# Patient Record
Sex: Male | Born: 1965 | Race: White | Hispanic: No | Marital: Married | Smoking: Never smoker
Health system: Southern US, Community
[De-identification: ages and names within clinical notes are randomized; demographics above are authoritative.]

## PROBLEM LIST (undated history)

## (undated) DIAGNOSIS — Q211 Atrial septal defect, unspecified: Secondary | ICD-10-CM

---

## 1994-08-25 HISTORY — PX: TRANSESOPHAGEAL ECHOCARDIOGRAM: SHX273

## 2010-05-01 ENCOUNTER — Ambulatory Visit (HOSPITAL_BASED_OUTPATIENT_CLINIC_OR_DEPARTMENT_OTHER): Admission: RE | Admit: 2010-05-01 | Discharge: 2010-05-01 | Payer: Self-pay | Admitting: Family Medicine

## 2010-05-01 ENCOUNTER — Ambulatory Visit: Payer: Self-pay | Admitting: Diagnostic Radiology

## 2011-05-02 ENCOUNTER — Encounter (INDEPENDENT_AMBULATORY_CARE_PROVIDER_SITE_OTHER): Payer: 59 | Admitting: Ophthalmology

## 2011-05-02 DIAGNOSIS — H43819 Vitreous degeneration, unspecified eye: Secondary | ICD-10-CM

## 2011-05-02 DIAGNOSIS — H33309 Unspecified retinal break, unspecified eye: Secondary | ICD-10-CM

## 2011-05-02 DIAGNOSIS — H33009 Unspecified retinal detachment with retinal break, unspecified eye: Secondary | ICD-10-CM

## 2011-05-02 DIAGNOSIS — H4420 Degenerative myopia, unspecified eye: Secondary | ICD-10-CM

## 2011-05-09 ENCOUNTER — Encounter (INDEPENDENT_AMBULATORY_CARE_PROVIDER_SITE_OTHER): Payer: 59 | Admitting: Ophthalmology

## 2011-05-09 DIAGNOSIS — H33309 Unspecified retinal break, unspecified eye: Secondary | ICD-10-CM

## 2011-09-08 ENCOUNTER — Ambulatory Visit (INDEPENDENT_AMBULATORY_CARE_PROVIDER_SITE_OTHER): Payer: 59 | Admitting: Ophthalmology

## 2011-09-18 ENCOUNTER — Ambulatory Visit (INDEPENDENT_AMBULATORY_CARE_PROVIDER_SITE_OTHER): Payer: 59 | Admitting: Ophthalmology

## 2011-09-25 ENCOUNTER — Ambulatory Visit (INDEPENDENT_AMBULATORY_CARE_PROVIDER_SITE_OTHER): Payer: 59 | Admitting: Ophthalmology

## 2011-09-25 DIAGNOSIS — H43819 Vitreous degeneration, unspecified eye: Secondary | ICD-10-CM

## 2011-09-25 DIAGNOSIS — H33009 Unspecified retinal detachment with retinal break, unspecified eye: Secondary | ICD-10-CM

## 2011-09-25 DIAGNOSIS — H4420 Degenerative myopia, unspecified eye: Secondary | ICD-10-CM

## 2011-09-25 DIAGNOSIS — H33309 Unspecified retinal break, unspecified eye: Secondary | ICD-10-CM

## 2011-09-25 DIAGNOSIS — H2510 Age-related nuclear cataract, unspecified eye: Secondary | ICD-10-CM

## 2012-09-24 ENCOUNTER — Ambulatory Visit (INDEPENDENT_AMBULATORY_CARE_PROVIDER_SITE_OTHER): Payer: 59 | Admitting: Ophthalmology

## 2012-09-24 DIAGNOSIS — H33009 Unspecified retinal detachment with retinal break, unspecified eye: Secondary | ICD-10-CM

## 2012-09-24 DIAGNOSIS — H43819 Vitreous degeneration, unspecified eye: Secondary | ICD-10-CM

## 2012-09-24 DIAGNOSIS — H33309 Unspecified retinal break, unspecified eye: Secondary | ICD-10-CM

## 2012-09-24 DIAGNOSIS — H4420 Degenerative myopia, unspecified eye: Secondary | ICD-10-CM

## 2013-06-06 ENCOUNTER — Encounter (INDEPENDENT_AMBULATORY_CARE_PROVIDER_SITE_OTHER): Payer: 59 | Admitting: Ophthalmology

## 2013-06-06 DIAGNOSIS — H2510 Age-related nuclear cataract, unspecified eye: Secondary | ICD-10-CM

## 2013-06-06 DIAGNOSIS — H1130 Conjunctival hemorrhage, unspecified eye: Secondary | ICD-10-CM

## 2013-06-06 DIAGNOSIS — H43819 Vitreous degeneration, unspecified eye: Secondary | ICD-10-CM

## 2013-06-06 DIAGNOSIS — H4420 Degenerative myopia, unspecified eye: Secondary | ICD-10-CM

## 2013-06-06 DIAGNOSIS — H33009 Unspecified retinal detachment with retinal break, unspecified eye: Secondary | ICD-10-CM

## 2013-09-26 ENCOUNTER — Ambulatory Visit (INDEPENDENT_AMBULATORY_CARE_PROVIDER_SITE_OTHER): Payer: 59 | Admitting: Ophthalmology

## 2013-10-28 ENCOUNTER — Encounter (INDEPENDENT_AMBULATORY_CARE_PROVIDER_SITE_OTHER): Payer: 59 | Admitting: Ophthalmology

## 2013-10-28 DIAGNOSIS — H43819 Vitreous degeneration, unspecified eye: Secondary | ICD-10-CM

## 2013-10-28 DIAGNOSIS — H35039 Hypertensive retinopathy, unspecified eye: Secondary | ICD-10-CM

## 2013-10-28 DIAGNOSIS — H33009 Unspecified retinal detachment with retinal break, unspecified eye: Secondary | ICD-10-CM

## 2013-10-28 DIAGNOSIS — H33309 Unspecified retinal break, unspecified eye: Secondary | ICD-10-CM

## 2013-10-28 DIAGNOSIS — H4420 Degenerative myopia, unspecified eye: Secondary | ICD-10-CM

## 2013-10-28 DIAGNOSIS — I10 Essential (primary) hypertension: Secondary | ICD-10-CM

## 2014-05-04 ENCOUNTER — Encounter (INDEPENDENT_AMBULATORY_CARE_PROVIDER_SITE_OTHER): Payer: 59 | Admitting: Ophthalmology

## 2014-05-04 DIAGNOSIS — I10 Essential (primary) hypertension: Secondary | ICD-10-CM

## 2014-05-04 DIAGNOSIS — H4420 Degenerative myopia, unspecified eye: Secondary | ICD-10-CM

## 2014-05-04 DIAGNOSIS — H2510 Age-related nuclear cataract, unspecified eye: Secondary | ICD-10-CM

## 2014-05-04 DIAGNOSIS — H35039 Hypertensive retinopathy, unspecified eye: Secondary | ICD-10-CM

## 2014-05-04 DIAGNOSIS — H43819 Vitreous degeneration, unspecified eye: Secondary | ICD-10-CM

## 2014-05-04 DIAGNOSIS — H33009 Unspecified retinal detachment with retinal break, unspecified eye: Secondary | ICD-10-CM

## 2014-05-31 ENCOUNTER — Encounter (INDEPENDENT_AMBULATORY_CARE_PROVIDER_SITE_OTHER): Payer: 59 | Admitting: Ophthalmology

## 2014-05-31 DIAGNOSIS — H4423 Degenerative myopia, bilateral: Secondary | ICD-10-CM

## 2014-05-31 DIAGNOSIS — H43813 Vitreous degeneration, bilateral: Secondary | ICD-10-CM

## 2014-05-31 DIAGNOSIS — H35033 Hypertensive retinopathy, bilateral: Secondary | ICD-10-CM

## 2014-05-31 DIAGNOSIS — H338 Other retinal detachments: Secondary | ICD-10-CM

## 2014-05-31 DIAGNOSIS — I10 Essential (primary) hypertension: Secondary | ICD-10-CM

## 2014-06-07 ENCOUNTER — Ambulatory Visit (INDEPENDENT_AMBULATORY_CARE_PROVIDER_SITE_OTHER): Payer: 59 | Admitting: Ophthalmology

## 2014-07-03 ENCOUNTER — Encounter (INDEPENDENT_AMBULATORY_CARE_PROVIDER_SITE_OTHER): Payer: 59 | Admitting: Ophthalmology

## 2014-07-03 DIAGNOSIS — I10 Essential (primary) hypertension: Secondary | ICD-10-CM

## 2014-07-03 DIAGNOSIS — H4423 Degenerative myopia, bilateral: Secondary | ICD-10-CM

## 2014-07-03 DIAGNOSIS — H2511 Age-related nuclear cataract, right eye: Secondary | ICD-10-CM

## 2014-07-03 DIAGNOSIS — H43813 Vitreous degeneration, bilateral: Secondary | ICD-10-CM

## 2014-07-03 DIAGNOSIS — H35033 Hypertensive retinopathy, bilateral: Secondary | ICD-10-CM

## 2014-07-03 DIAGNOSIS — H338 Other retinal detachments: Secondary | ICD-10-CM

## 2015-01-01 ENCOUNTER — Ambulatory Visit (INDEPENDENT_AMBULATORY_CARE_PROVIDER_SITE_OTHER): Payer: 59 | Admitting: Ophthalmology

## 2015-04-16 ENCOUNTER — Ambulatory Visit (INDEPENDENT_AMBULATORY_CARE_PROVIDER_SITE_OTHER): Payer: 59 | Admitting: Ophthalmology

## 2015-04-16 DIAGNOSIS — H35033 Hypertensive retinopathy, bilateral: Secondary | ICD-10-CM

## 2015-04-16 DIAGNOSIS — H43813 Vitreous degeneration, bilateral: Secondary | ICD-10-CM | POA: Diagnosis not present

## 2015-04-16 DIAGNOSIS — I10 Essential (primary) hypertension: Secondary | ICD-10-CM

## 2015-04-16 DIAGNOSIS — H4423 Degenerative myopia, bilateral: Secondary | ICD-10-CM | POA: Diagnosis not present

## 2015-04-16 DIAGNOSIS — H2511 Age-related nuclear cataract, right eye: Secondary | ICD-10-CM

## 2015-04-16 DIAGNOSIS — H338 Other retinal detachments: Secondary | ICD-10-CM | POA: Diagnosis not present

## 2015-05-08 ENCOUNTER — Ambulatory Visit (INDEPENDENT_AMBULATORY_CARE_PROVIDER_SITE_OTHER): Payer: 59 | Admitting: Ophthalmology

## 2016-04-23 ENCOUNTER — Ambulatory Visit (INDEPENDENT_AMBULATORY_CARE_PROVIDER_SITE_OTHER): Payer: 59 | Admitting: Ophthalmology

## 2016-05-07 ENCOUNTER — Ambulatory Visit (INDEPENDENT_AMBULATORY_CARE_PROVIDER_SITE_OTHER): Payer: 59 | Admitting: Ophthalmology

## 2016-05-21 ENCOUNTER — Ambulatory Visit (INDEPENDENT_AMBULATORY_CARE_PROVIDER_SITE_OTHER): Payer: 59 | Admitting: Ophthalmology

## 2016-05-21 DIAGNOSIS — H338 Other retinal detachments: Secondary | ICD-10-CM

## 2016-05-21 DIAGNOSIS — H35033 Hypertensive retinopathy, bilateral: Secondary | ICD-10-CM | POA: Diagnosis not present

## 2016-05-21 DIAGNOSIS — I10 Essential (primary) hypertension: Secondary | ICD-10-CM | POA: Diagnosis not present

## 2016-05-21 DIAGNOSIS — H43813 Vitreous degeneration, bilateral: Secondary | ICD-10-CM

## 2016-05-21 DIAGNOSIS — H4423 Degenerative myopia, bilateral: Secondary | ICD-10-CM | POA: Diagnosis not present

## 2016-10-11 DIAGNOSIS — H578 Other specified disorders of eye and adnexa: Secondary | ICD-10-CM | POA: Diagnosis not present

## 2016-11-08 DIAGNOSIS — H578 Other specified disorders of eye and adnexa: Secondary | ICD-10-CM | POA: Diagnosis not present

## 2017-01-05 DIAGNOSIS — Z131 Encounter for screening for diabetes mellitus: Secondary | ICD-10-CM | POA: Diagnosis not present

## 2017-01-05 DIAGNOSIS — E7800 Pure hypercholesterolemia, unspecified: Secondary | ICD-10-CM | POA: Diagnosis not present

## 2017-01-05 DIAGNOSIS — Z0000 Encounter for general adult medical examination without abnormal findings: Secondary | ICD-10-CM | POA: Diagnosis not present

## 2017-01-05 DIAGNOSIS — G44219 Episodic tension-type headache, not intractable: Secondary | ICD-10-CM | POA: Diagnosis not present

## 2017-01-05 DIAGNOSIS — Z125 Encounter for screening for malignant neoplasm of prostate: Secondary | ICD-10-CM | POA: Diagnosis not present

## 2017-04-04 DIAGNOSIS — H40033 Anatomical narrow angle, bilateral: Secondary | ICD-10-CM | POA: Diagnosis not present

## 2017-04-04 DIAGNOSIS — H04123 Dry eye syndrome of bilateral lacrimal glands: Secondary | ICD-10-CM | POA: Diagnosis not present

## 2017-04-26 ENCOUNTER — Emergency Department (HOSPITAL_BASED_OUTPATIENT_CLINIC_OR_DEPARTMENT_OTHER)
Admission: EM | Admit: 2017-04-26 | Discharge: 2017-04-26 | Disposition: A | Discharge status: Home / Self Care | Payer: 59 | Attending: Emergency Medicine | Admitting: Emergency Medicine

## 2017-04-26 ENCOUNTER — Encounter (HOSPITAL_BASED_OUTPATIENT_CLINIC_OR_DEPARTMENT_OTHER): Payer: Self-pay | Admitting: Emergency Medicine

## 2017-04-26 DIAGNOSIS — S59911A Unspecified injury of right forearm, initial encounter: Secondary | ICD-10-CM | POA: Diagnosis not present

## 2017-04-26 DIAGNOSIS — W458XXA Other foreign body or object entering through skin, initial encounter: Secondary | ICD-10-CM | POA: Insufficient documentation

## 2017-04-26 DIAGNOSIS — S50851A Superficial foreign body of right forearm, initial encounter: Secondary | ICD-10-CM | POA: Diagnosis not present

## 2017-04-26 DIAGNOSIS — Y929 Unspecified place or not applicable: Secondary | ICD-10-CM | POA: Diagnosis not present

## 2017-04-26 DIAGNOSIS — S51841A Puncture wound with foreign body of right forearm, initial encounter: Secondary | ICD-10-CM | POA: Insufficient documentation

## 2017-04-26 DIAGNOSIS — Z23 Encounter for immunization: Secondary | ICD-10-CM | POA: Diagnosis not present

## 2017-04-26 DIAGNOSIS — Y999 Unspecified external cause status: Secondary | ICD-10-CM | POA: Insufficient documentation

## 2017-04-26 DIAGNOSIS — Y9389 Activity, other specified: Secondary | ICD-10-CM | POA: Insufficient documentation

## 2017-04-26 MED ORDER — TETANUS-DIPHTH-ACELL PERTUSSIS 5-2.5-18.5 LF-MCG/0.5 IM SUSP
0.5000 mL | Freq: Once | INTRAMUSCULAR | Status: AC
  Administered 2017-04-26: 0.5 mL via INTRAMUSCULAR
  Filled 2017-04-26: qty 0.5

## 2017-04-26 NOTE — ED Triage Notes (Signed)
Pt has fish hook in R forearm.

## 2017-04-26 NOTE — Discharge Instructions (Signed)
Wash with soap and water.  Neosporin to area

## 2017-04-26 NOTE — ED Provider Notes (Signed)
[facility] Provider Note   CSN: [id number] Arrival date & time: 04/26/17  1833     History   Chief Complaint Chief Complaint  Patient presents with  . Puncture Wound    HPI Terry Gillespie is a 51 y.o. male.  Patient is a 51 year old male presenting today because he has a fishhook stuck in his right arm. He was fishing when his lower got stuck any pullback causing the hook to get lodged into the palmar surface of his distal right forearm. He denies any numbness or tingling in his fingers to his no excessive bleeding. Tetanus shot is out of date.  Only minimal pain at this time.   The history is provided by the patient.    History reviewed. No pertinent past medical history.  There are no active problems to display for this patient.   History reviewed. No pertinent surgical history.     Home Medications    Prior to Admission medications   Not on File    Family History No family history on file.  Social History Social History  Substance Use Topics  . Smoking status: Never Smoker  . Smokeless tobacco: Never Used  . Alcohol use No     Allergies   Patient has no known allergies.   Review of Systems Review of Systems  All other systems reviewed and are negative.    Physical Exam Updated Vital Signs BP (!) 136/98 (BP Location: Left Arm)   Pulse 89   Temp 98.3 F (36.8 C) (Oral)   Resp 18   SpO2 100%   Physical Exam  Constitutional: He is oriented to person, place, and time. He appears well-developed and well-nourished. No distress.  HENT:  Head: Normocephalic.  Cardiovascular: Normal rate.   Pulmonary/Chest: Effort normal.  Musculoskeletal: Normal range of motion. He exhibits tenderness. He exhibits no edema.       Right wrist: He exhibits tenderness.       Arms: Normal sensation and movement of the right hand and fingers. 2+ radial pulse.  Neurological: He is alert and oriented to person, place, and time.  Skin: Skin is  warm and dry. No rash noted. No erythema.  Psychiatric: He has a normal mood and affect. His behavior is normal.  Nursing note and vitals reviewed.    ED Treatments / Results  Labs (all labs ordered are listed, but only abnormal results are displayed) Labs Reviewed - No data to display  EKG  EKG Interpretation None       Radiology No results found.  Procedures .Foreign Body Removal Date/Time: 04/26/2017 9:28 PM Performed by: Blanchie Dessert Authorized by: Blanchie Dessert  Consent: Verbal consent obtained. Consent given by: patient Patient understanding: patient states understanding of the procedure being performed Imaging studies: imaging studies not available Patient identity confirmed: verbally with patient Body area: skin General location: upper extremity Location details: right forearm Anesthesia: local infiltration  Anesthesia: Local Anesthetic: lidocaine 2% with epinephrine Anesthetic total: 2 mL  Sedation: Patient sedated: no Patient restrained: no Patient cooperative: yes Localization method: visualized Removal mechanism: fish hook was advanced through the skin with wire cutters and barb was cut and then hook removed. Dressing: antibiotic ointment and dressing applied Tendon involvement: superficial Depth: subcutaneous Complexity: simple 1 objects recovered. Objects recovered: fish hook Post-procedure assessment: foreign body removed Patient tolerance: Patient tolerated the procedure well with no immediate complications   (including critical care time)  Medications Ordered in ED Medications  Tdap (BOOSTRIX) injection 0.5 mL (  not administered)     Initial Impression / Assessment and Plan / ED Course  I have reviewed the triage vital signs and the nursing notes.  Pertinent labs & imaging results that were available during my care of the patient were reviewed by me and considered in my medical decision making (see chart for details).      Patient with fishhook to the right forearm.  Tetanus shot updated. Hook removed as above.  Final Clinical Impressions(s) / ED Diagnoses   Final diagnoses:  Foreign body in right forearm, initial encounter    New Prescriptions New Prescriptions   No medications on file     Blanchie Dessert, MD 04/26/17 2130

## 2017-05-27 ENCOUNTER — Ambulatory Visit (INDEPENDENT_AMBULATORY_CARE_PROVIDER_SITE_OTHER): Payer: 59 | Admitting: Ophthalmology

## 2017-06-04 DIAGNOSIS — Z23 Encounter for immunization: Secondary | ICD-10-CM | POA: Diagnosis not present

## 2017-06-04 DIAGNOSIS — H04123 Dry eye syndrome of bilateral lacrimal glands: Secondary | ICD-10-CM | POA: Diagnosis not present

## 2017-06-15 ENCOUNTER — Encounter (INDEPENDENT_AMBULATORY_CARE_PROVIDER_SITE_OTHER): Payer: 59 | Admitting: Ophthalmology

## 2017-06-15 DIAGNOSIS — H35033 Hypertensive retinopathy, bilateral: Secondary | ICD-10-CM

## 2017-06-15 DIAGNOSIS — H338 Other retinal detachments: Secondary | ICD-10-CM

## 2017-06-15 DIAGNOSIS — H4423 Degenerative myopia, bilateral: Secondary | ICD-10-CM | POA: Diagnosis not present

## 2017-06-15 DIAGNOSIS — H43813 Vitreous degeneration, bilateral: Secondary | ICD-10-CM | POA: Diagnosis not present

## 2017-06-15 DIAGNOSIS — I10 Essential (primary) hypertension: Secondary | ICD-10-CM

## 2017-07-24 DIAGNOSIS — H04123 Dry eye syndrome of bilateral lacrimal glands: Secondary | ICD-10-CM | POA: Diagnosis not present

## 2017-10-26 DIAGNOSIS — H1013 Acute atopic conjunctivitis, bilateral: Secondary | ICD-10-CM | POA: Diagnosis not present

## 2017-12-07 DIAGNOSIS — R0781 Pleurodynia: Secondary | ICD-10-CM | POA: Diagnosis not present

## 2017-12-07 DIAGNOSIS — B351 Tinea unguium: Secondary | ICD-10-CM | POA: Diagnosis not present

## 2018-01-28 DIAGNOSIS — E7800 Pure hypercholesterolemia, unspecified: Secondary | ICD-10-CM | POA: Diagnosis not present

## 2018-01-28 DIAGNOSIS — Z0000 Encounter for general adult medical examination without abnormal findings: Secondary | ICD-10-CM | POA: Diagnosis not present

## 2018-03-16 DIAGNOSIS — D123 Benign neoplasm of transverse colon: Secondary | ICD-10-CM | POA: Diagnosis not present

## 2018-03-16 DIAGNOSIS — Z1211 Encounter for screening for malignant neoplasm of colon: Secondary | ICD-10-CM | POA: Diagnosis not present

## 2018-05-31 DIAGNOSIS — Z23 Encounter for immunization: Secondary | ICD-10-CM | POA: Diagnosis not present

## 2018-06-15 ENCOUNTER — Encounter (INDEPENDENT_AMBULATORY_CARE_PROVIDER_SITE_OTHER): Payer: 59 | Admitting: Ophthalmology

## 2018-07-19 ENCOUNTER — Encounter (INDEPENDENT_AMBULATORY_CARE_PROVIDER_SITE_OTHER): Payer: 59 | Admitting: Ophthalmology

## 2018-07-19 DIAGNOSIS — H4423 Degenerative myopia, bilateral: Secondary | ICD-10-CM

## 2018-07-19 DIAGNOSIS — H338 Other retinal detachments: Secondary | ICD-10-CM | POA: Diagnosis not present

## 2018-07-19 DIAGNOSIS — H35033 Hypertensive retinopathy, bilateral: Secondary | ICD-10-CM | POA: Diagnosis not present

## 2018-07-19 DIAGNOSIS — I10 Essential (primary) hypertension: Secondary | ICD-10-CM

## 2018-07-19 DIAGNOSIS — H43813 Vitreous degeneration, bilateral: Secondary | ICD-10-CM

## 2018-08-07 DIAGNOSIS — H04123 Dry eye syndrome of bilateral lacrimal glands: Secondary | ICD-10-CM | POA: Diagnosis not present

## 2018-08-20 DIAGNOSIS — H10013 Acute follicular conjunctivitis, bilateral: Secondary | ICD-10-CM | POA: Diagnosis not present

## 2018-08-27 DIAGNOSIS — S61210A Laceration without foreign body of right index finger without damage to nail, initial encounter: Secondary | ICD-10-CM | POA: Diagnosis not present

## 2018-08-27 DIAGNOSIS — G8911 Acute pain due to trauma: Secondary | ICD-10-CM | POA: Diagnosis not present

## 2018-09-13 DIAGNOSIS — H1013 Acute atopic conjunctivitis, bilateral: Secondary | ICD-10-CM | POA: Diagnosis not present

## 2018-09-22 DIAGNOSIS — B09 Unspecified viral infection characterized by skin and mucous membrane lesions: Secondary | ICD-10-CM | POA: Diagnosis not present

## 2018-10-05 DIAGNOSIS — L237 Allergic contact dermatitis due to plants, except food: Secondary | ICD-10-CM | POA: Diagnosis not present

## 2019-07-20 ENCOUNTER — Encounter (INDEPENDENT_AMBULATORY_CARE_PROVIDER_SITE_OTHER): Payer: 59 | Admitting: Ophthalmology

## 2020-01-11 ENCOUNTER — Emergency Department (INDEPENDENT_AMBULATORY_CARE_PROVIDER_SITE_OTHER)
Admission: EM | Admit: 2020-01-11 | Discharge: 2020-01-11 | Disposition: A | Discharge status: Home / Self Care | Payer: 59 | Source: Home / Self Care | Attending: Emergency Medicine | Admitting: Emergency Medicine

## 2020-01-11 ENCOUNTER — Other Ambulatory Visit: Payer: Self-pay

## 2020-01-11 DIAGNOSIS — R300 Dysuria: Secondary | ICD-10-CM

## 2020-01-11 DIAGNOSIS — R112 Nausea with vomiting, unspecified: Secondary | ICD-10-CM

## 2020-01-11 DIAGNOSIS — N23 Unspecified renal colic: Secondary | ICD-10-CM | POA: Diagnosis not present

## 2020-01-11 LAB — POCT URINALYSIS DIP (MANUAL ENTRY)
Glucose, UA: NEGATIVE mg/dL
Leukocytes, UA: NEGATIVE
Nitrite, UA: NEGATIVE
Protein Ur, POC: 100 mg/dL — AB
Spec Grav, UA: >=1.03 — AB (ref 1.010–1.025)
Urobilinogen, UA: 1 E.U./dL
pH, UA: 5.5 (ref 5.0–8.0)

## 2020-01-11 MED ORDER — KETOROLAC TROMETHAMINE 60 MG/2ML IM SOLN
60.0000 mg | Freq: Once | INTRAMUSCULAR | Status: AC
  Administered 2020-01-11: 60 mg via INTRAMUSCULAR

## 2020-01-11 MED ORDER — PROMETHAZINE HCL 25 MG/ML IJ SOLN
25.0000 mg | INTRAMUSCULAR | Status: AC
  Administered 2020-01-11: 25 mg via INTRAMUSCULAR

## 2020-01-11 NOTE — Discharge Instructions (Addendum)
You likely have a right kidney stone, based on history of severe acute right back pain, nausea and vomiting. Urinalysis is positive for large amount of blood. Here in urgent care, we gave you a shot of Toradol 60 mg for pain and promethazine 25 mg for nausea and vomiting. Your wife needs to drive you now to local emergency room

## 2020-01-11 NOTE — ED Notes (Signed)
Patient is being discharged from the [facility] and sent to the Emergency Department via POV driven by wife. Per Dr. Burnett Harry, patient is stable but in need of higher level of care due to possible kidney stone and severe flank pain. Patient is aware and verbalizes understanding of plan of care.  Vitals:   01/11/20 1936  BP: 122/74  Pulse: 74  Resp: 17  Temp: 98.6 F (37 C)  SpO2: 100%

## 2020-01-11 NOTE — ED Provider Notes (Signed)
[facility]    CSN: [id number] Arrival date & time: 01/11/20  1924      History   Chief Complaint Chief Complaint  Patient presents with  . Dysuria    HPI Terry Gillespie is a 54 y.o. male.   HPI Patient presents to Urgent Care with complaints of discolored urine and right lower quadrant abdominal pain since about 45 minutes ago.  He was feeling well 1 hour ago before this started.   Patient reports he has been taking tumeric, not sure if that is related to his discolored urine. Pt is worried about either a kidney stone or appendicits. History of kidney stone many years ago.  History reviewed. No pertinent past medical history.  There are no problems to display for this patient.   History reviewed. No pertinent surgical history.     Home Medications    Prior to Admission medications   Not on File    Family History Family History  Problem Relation Age of Onset  . Heart failure Mother   . Heart failure Father   . Stroke Father   . Diabetes Father   . Cancer Father     Social History Social History   Tobacco Use  . Smoking status: Never Smoker  . Smokeless tobacco: Never Used  Substance Use Topics  . Alcohol use: Yes    Comment: rare  . Drug use: No     Allergies   Patient has no known allergies.   Review of Systems Review of Systems Positive for nausea and vomiting Right back pain. No chest pain or shortness of breath.  Physical Exam Triage Vital Signs ED Triage Vitals  Enc Vitals Group     BP 01/11/20 1936 122/74     Pulse Rate 01/11/20 1936 74     Resp 01/11/20 1936 17     Temp 01/11/20 1936 98.6 F (37 C)     Temp Source 01/11/20 1936 Oral     SpO2 01/11/20 1936 100 %     Weight --      Height --      Head Circumference --      Peak Flow --      Pain Score 01/11/20 1934 8     Pain Loc --      Pain Edu? --      Excl. in [place]? --    No data found.  Updated Vital Signs BP 122/74 (BP Location: Right Arm)    Pulse 74   Temp 98.6 F (37 C) (Oral)   Resp 17   SpO2 100%   Visual Acuity Right Eye Distance:   Left Eye Distance:   Bilateral Distance:    Right Eye Near:   Left Eye Near:    Bilateral Near:     Physical Exam He is alert, but in acute distress.  Actively vomiting nonbloody vomitus.  Complaining of right back pain. Lungs clear. Heart regular rate and rhythm. Abdomen soft, no specific tenderness or masses.  No rebound  UC Treatments / Results  Labs (all labs ordered are listed, but only abnormal results are displayed) Labs Reviewed  POCT URINALYSIS DIP (MANUAL ENTRY) - Abnormal; Notable for the following components:      Result Value   Color, UA brown (*)    Clarity, UA cloudy (*)    Bilirubin, UA small (*)    Ketones, POC UA trace (5) (*)    Spec Grav, UA >=1.030 (*)    Blood, UA  large (*)    Protein Ur, POC =100 (*)    All other components within normal limits  URINE CULTURE   UA: Large blood.    Radiology No results found.  Procedures Procedures (including critical care time)  Medications Ordered in UC Medications  promethazine (PHENERGAN) injection 25 mg (25 mg Intramuscular Given 01/11/20 2017)  ketorolac (TORADOL) injection 60 mg (60 mg Intramuscular Given 01/11/20 2017)    Initial Impression / Assessment and Plan / UC Course  I have reviewed the triage vital signs and the nursing notes.  Pertinent labs & imaging results that were available during my care of the patient were reviewed by me and considered in my medical decision making (see chart for details).      Final Clinical Impressions(s) / UC Diagnoses   Final diagnoses:  Dysuria  Renal colic on right side  Non-intractable vomiting with nausea, unspecified vomiting type     Discharge Instructions     You likely have a right kidney stone, based on history of severe acute right back pain, nausea and vomiting. Urinalysis is positive for large amount of blood. Here in urgent care, we  gave you a shot of Toradol 60 mg for pain and promethazine 25 mg for nausea and vomiting. Your wife needs to drive you now to local emergency room    After Toradol and promethazine given, his pain improved slightly and nausea improved slightly. Discussed with wife and she is driving him now to [facility] emergency room. They declined the option of calling EMS.  ED Prescriptions    None     PDMP not reviewed this encounter.   Jacqulyn Cane, MD 01/11/20 2031

## 2020-01-11 NOTE — ED Triage Notes (Signed)
Patient presents to Urgent Care with complaints of discolored urine and right lower quadrant abdominal pain since about 45 minutes ago. Patient reports he has been taking tumeric, not sure if that is related to his discolored urine. Pt is worried about either a kidney stone or appendicits.

## 2020-01-12 ENCOUNTER — Other Ambulatory Visit (HOSPITAL_COMMUNITY)
Admission: RE | Admit: 2020-01-12 | Discharge: 2020-01-12 | Disposition: A | Discharge status: Home / Self Care | Payer: 59 | Source: Ambulatory Visit | Attending: Urology | Admitting: Urology

## 2020-01-12 ENCOUNTER — Other Ambulatory Visit: Payer: Self-pay | Admitting: Urology

## 2020-01-12 DIAGNOSIS — Z20822 Contact with and (suspected) exposure to covid-19: Secondary | ICD-10-CM | POA: Diagnosis not present

## 2020-01-12 DIAGNOSIS — Z01812 Encounter for preprocedural laboratory examination: Secondary | ICD-10-CM | POA: Insufficient documentation

## 2020-01-12 DIAGNOSIS — N200 Calculus of kidney: Secondary | ICD-10-CM

## 2020-01-12 NOTE — Progress Notes (Signed)
Left message for patient to call back for pre-op instructions.  

## 2020-01-13 LAB — URINE CULTURE
MICRO NUMBER:: 10497258
Result:: NO GROWTH
SPECIMEN QUALITY:: ADEQUATE

## 2020-01-13 LAB — SARS CORONAVIRUS 2 (TAT 6-24 HRS): SARS Coronavirus 2: NEGATIVE

## 2020-01-13 NOTE — Progress Notes (Signed)
Patient to arrive at 0800 on 01/16/20, History and medication list reviewed. Pre-op instructions given. NPO after midnight. Driver secured.

## 2020-01-16 ENCOUNTER — Ambulatory Visit (HOSPITAL_COMMUNITY): Payer: 59

## 2020-01-16 ENCOUNTER — Encounter (HOSPITAL_BASED_OUTPATIENT_CLINIC_OR_DEPARTMENT_OTHER): Payer: Self-pay | Admitting: Urology

## 2020-01-16 ENCOUNTER — Ambulatory Visit (HOSPITAL_BASED_OUTPATIENT_CLINIC_OR_DEPARTMENT_OTHER)
Admission: RE | Admit: 2020-01-16 | Discharge: 2020-01-16 | Disposition: A | Discharge status: Home / Self Care | Payer: 59 | Attending: Urology | Admitting: Urology

## 2020-01-16 ENCOUNTER — Encounter (HOSPITAL_BASED_OUTPATIENT_CLINIC_OR_DEPARTMENT_OTHER)
Admission: RE | Disposition: A | Discharge status: Home / Self Care | Payer: Self-pay | Source: Home / Self Care | Attending: Urology

## 2020-01-16 DIAGNOSIS — Z8249 Family history of ischemic heart disease and other diseases of the circulatory system: Secondary | ICD-10-CM | POA: Insufficient documentation

## 2020-01-16 DIAGNOSIS — Z8042 Family history of malignant neoplasm of prostate: Secondary | ICD-10-CM | POA: Insufficient documentation

## 2020-01-16 DIAGNOSIS — N201 Calculus of ureter: Secondary | ICD-10-CM | POA: Diagnosis not present

## 2020-01-16 DIAGNOSIS — N200 Calculus of kidney: Secondary | ICD-10-CM

## 2020-01-16 DIAGNOSIS — Z841 Family history of disorders of kidney and ureter: Secondary | ICD-10-CM | POA: Insufficient documentation

## 2020-01-16 DIAGNOSIS — Q211 Atrial septal defect: Secondary | ICD-10-CM | POA: Insufficient documentation

## 2020-01-16 DIAGNOSIS — Z823 Family history of stroke: Secondary | ICD-10-CM | POA: Diagnosis not present

## 2020-01-16 DIAGNOSIS — N202 Calculus of kidney with calculus of ureter: Secondary | ICD-10-CM | POA: Diagnosis present

## 2020-01-16 HISTORY — PX: EXTRACORPOREAL SHOCK WAVE LITHOTRIPSY: SHX1557

## 2020-01-16 SURGERY — LITHOTRIPSY, ESWL
Anesthesia: LOCAL | Laterality: Right

## 2020-01-16 MED ORDER — DIPHENHYDRAMINE HCL 25 MG PO CAPS
25.0000 mg | ORAL_CAPSULE | ORAL | Status: AC
  Administered 2020-01-16: 25 mg via ORAL

## 2020-01-16 MED ORDER — SODIUM CHLORIDE 0.9 % IV SOLN: INTRAVENOUS | Status: DC

## 2020-01-16 MED ORDER — OXYCODONE HCL 5 MG PO TABS: 5.0000 mg | ORAL_TABLET | ORAL | Status: DC | PRN

## 2020-01-16 MED ORDER — CIPROFLOXACIN HCL 500 MG PO TABS
ORAL_TABLET | ORAL | Status: AC
  Filled 2020-01-16: qty 1

## 2020-01-16 MED ORDER — CIPROFLOXACIN HCL 500 MG PO TABS
500.0000 mg | ORAL_TABLET | ORAL | Status: AC
  Administered 2020-01-16: 500 mg via ORAL

## 2020-01-16 MED ORDER — DIAZEPAM 5 MG PO TABS
ORAL_TABLET | ORAL | Status: AC
  Filled 2020-01-16: qty 2

## 2020-01-16 MED ORDER — DIPHENHYDRAMINE HCL 25 MG PO CAPS
ORAL_CAPSULE | ORAL | Status: AC
  Filled 2020-01-16: qty 1

## 2020-01-16 MED ORDER — DIAZEPAM 5 MG PO TABS
10.0000 mg | ORAL_TABLET | ORAL | Status: AC
  Administered 2020-01-16: 10 mg via ORAL

## 2020-01-16 NOTE — H&P (Signed)
I have ureteral stone.  HPI: Terry Gillespie is a 54 year-old male patient who was referred by Dr. Madelon Lips. Terry Gillespie), MD who is here for ureteral stone.  The problem is on the right side. He first stated noticing pain on approximately 01/11/2020. This is not his first kidney stone. He is currently having flank pain. He denies having back pain, groin pain, nausea, vomiting, fever, and chills. Pain is occuring on the right side. He has not caught a stone in his urine strainer since his symptoms began.   He has never had surgical treatment for calculi in the past.   Terry Gillespie went to know what emergency department yesterday. He had right flank pain. CT scan revealed a 6 mm right proximal ureteral stone and a 7 mm and 3 mm left renal stones by report. His white count was 16.9, creatinine 1.3, UA with 30-40 red blood cells. UA today greater than 60 red blood cells, no bacteria.   He noted dark urine past week. Today, he has not seen stone pass. He is drinking fluids. He slept great last night. Some mild right flank pain.   KUB today with 6 mm right proximal ureteral stone noted. A 6 mm left renal stone.   He passed a stone 10 yrs ago and told to stay away from nuts, spinach and beer.     ALLERGIES: None   MEDICATIONS: Fish Oil  Turmeric     GU PSH: None   NON-GU PSH: Eye Surgery (Unspecified), Bilateral - about 2015     GU PMH: None   NON-GU PMH: Hypercholesterolemia Hypertension    FAMILY HISTORY: 1 Daughter - Daughter 1 son - Son Acute CVA (cerebrovascular accident) - Father father deceased at age 44 - Father Heart Disease - Mother, Father kidney stone - Father mother deceased at age 47 - Mother Prostate Cancer - Father   SOCIAL HISTORY: Marital Status: Married Ethnicity: Not Hispanic Or Latino; Race: White Current Smoking Status: Patient has never smoked.   Tobacco Use Assessment Completed: Used Tobacco in last 30 days? Drinks 3 drinks per year.  Drinks 2  caffeinated drinks per day. Patient's occupation is/was retired.    REVIEW OF SYSTEMS:    GU Review Male:   Patient reports get up at night to urinate. Patient denies frequent urination, hard to postpone urination, burning/ pain with urination, leakage of urine, stream starts and stops, trouble starting your stream, have to strain to urinate , erection problems, and penile pain.  Gastrointestinal (Upper):   Patient reports nausea and vomiting. Patient denies indigestion/ heartburn.  Gastrointestinal (Lower):   Patient denies diarrhea and constipation.  Constitutional:   Patient reports fatigue. Patient denies fever, night sweats, and weight loss.  Skin:   Patient denies skin rash/ lesion and itching.  Eyes:   Patient denies blurred vision and double vision.  Ears/ Nose/ Throat:   Patient denies sore throat and sinus problems.  Hematologic/Lymphatic:   Patient denies swollen glands and easy bruising.  Cardiovascular:   Patient denies leg swelling and chest pains.  Respiratory:   Patient denies shortness of breath and cough.  Endocrine:   Patient denies excessive thirst.  Musculoskeletal:   Patient reports back pain. Patient denies joint pain.  Neurological:   Patient denies headaches and dizziness.  Psychologic:   Patient denies depression and anxiety.   VITAL SIGNS:      01/12/2020 10:07 AM  Weight 177 lb / 80.29 kg  Height 70 in / 177.8 cm  BP 125/84 mmHg  Pulse 79 /min  Temperature 98.2 F / 36.7 C  BMI 25.4 kg/m   GU PHYSICAL EXAMINATION:    Anus and Perineum: No hemorrhoids. No anal stenosis. No rectal fissure, no anal fissure. No edema, no dimple, no perineal tenderness, no anal tenderness.  Scrotum: No lesions. No edema. No cysts. No warts.  Epididymides: Right: no spermatocele, no masses, no cysts, no tenderness, no induration, no enlargement. Left: no spermatocele, no masses, no cysts, no tenderness, no induration, no enlargement.  Testes: No tenderness, no swelling, no  enlargement left testes. No tenderness, no swelling, no enlargement right testes. Normal location left testes. Normal location right testes. No mass, no cyst, no varicocele, no hydrocele left testes. No mass, no cyst, no varicocele, no hydrocele right testes.  Urethral Meatus: Normal size. No lesion, no wart, no discharge, no polyp. Normal location.  Penis: Circumcised, no warts, no cracks. No dorsal Peyronie's plaques, no left corporal Peyronie's plaques, no right corporal Peyronie's plaques, no scarring, no warts. No balanitis, no meatal stenosis.  Prostate: 40 gram or 2+ size. Left lobe normal consistency, right lobe normal consistency. Symmetrical lobes. No prostate nodule. Left lobe no tenderness, right lobe no tenderness.  Seminal Vesicles: Nonpalpable.  Sphincter Tone: Normal sphincter. No rectal tenderness. No rectal mass.    MULTI-SYSTEM PHYSICAL EXAMINATION:    Constitutional: Well-nourished. No physical deformities. Normally developed. Good grooming.  Neck: Neck symmetrical, not swollen. Normal tracheal position.  Respiratory: No labored breathing, no use of accessory muscles.   Cardiovascular: Normal temperature, normal extremity pulses, no swelling, no varicosities.  Lymphatic: No enlargement of neck, axillae, groin.  Skin: No paleness, no jaundice, no cyanosis. No lesion, no ulcer, no rash.  Neurologic / Psychiatric: Oriented to time, oriented to place, oriented to person. No depression, no anxiety, no agitation.  Gastrointestinal: No mass, no tenderness, no rigidity, non obese abdomen.  Eyes: Normal conjunctivae. Normal eyelids.  Ears, Nose, Mouth, and Throat: Left ear no scars, no lesions, no masses. Right ear no scars, no lesions, no masses. Nose no scars, no lesions, no masses. Normal hearing. Normal lips.  Musculoskeletal: Normal gait and station of head and neck.     Complexity of Data:  Source Of History:  Patient  Records Review:   Previous Doctor Records  X-Ray Review:  C.T. Abdomen/Pelvis: Reviewed Report. 01/11/2020    PROCEDURES:         KUB - 74018  A single view of the abdomen is obtained.  Calculi:  6 mm left renal stone. 6 mm right proximal ureteral stone noted.       The bones appeared normal. The bowel gas pattern appeared normal. The soft tissues were unremarkable. Patient confirmed No Neulasta OnPro Device.            Urinalysis w/Scope Micro  WBC/hpf: NS (Not Seen)  RBC/hpf: >60/hpf  Bacteria: NS (Not Seen)  Cystals: NS (Not Seen)  Casts: NS (Not Seen)  Trichomonas: Not Present  Mucous: Not Present  Epithelial Cells: 6 - 10/hpf  Yeast: NS (Not Seen)  Sperm: Not Present         Ketoralac 60mg  - [id number], [id number] The site was sterilely prepped with alcohol. 60 mg of was injected IM using standard technique. The patient tolerated the procedure well. A band aid was applied. The site was dry when the patient left the exam room.    Qty: 60 Adm. By: Jeanette Caprice  Unit: mg Lot No [id number]  Route: IM Exp. Date  09/25/2021  Freq: None Mfgr.:   Site: Right Buttock   ASSESSMENT:      ICD-10 Details  1 GU:   Ureteral calculus - A999333 Acute, Uncomplicated - Discussed KUB and I discussed with the patient the nature risks and benefits of continued stone passage, off label use of alpha blockers, shockwave lithotripsy or ureteroscopy. All questions answered. We even discussed bilateral URS. He will schedule ESWL, right next week. He was given 60 mg IM toradol.   2   Renal calculus - N20.0 Chronic, Stable - consider tx over time   3   ED due to arterial insufficiency - N52.01 Chronic, Stable - He mentioned ED on the way out and he hasn't taken anything for it. He's noticed 6-7 yrs ago. Some performance anxiety. No ntg use. We discussed the nature r/b/a of pde5i and Rx sent.    PLAN:            Medications New Meds: Sildenafil Citrate 20 mg tablet take 1 -5 tablets po about 1 hour before sexual activity   #30  3 Refill(s)             Orders X-Rays: KUB          Schedule Return Visit/Planned Activity: Next Available Appointment - Schedule Surgery

## 2020-01-16 NOTE — Discharge Instructions (Signed)
See [facility] discharge instructions in chart.   Post Anesthesia Home Care Instructions  Activity: Get plenty of rest for the remainder of the day. A responsible individual must stay with you for 24 hours following the procedure.  For the next 24 hours, DO NOT: -Drive a car -Operate machinery -Drink alcoholic beverages -Take any medication unless instructed by your physician -Make any legal decisions or sign important papers.  Meals: Start with liquid foods such as gelatin or soup. Progress to regular foods as tolerated. Avoid greasy, spicy, heavy foods. If nausea and/or vomiting occur, drink only clear liquids until the nausea and/or vomiting subsides. Call your physician if vomiting continues.  Special Instructions/Symptoms: Your throat may feel dry or sore from the anesthesia or the breathing tube placed in your throat during surgery. If this causes discomfort, gargle with warm salt water. The discomfort should disappear within 24 hours.       

## 2020-01-16 NOTE — Op Note (Signed)
See [facility] OP note scanned into chart. Also because of the size, density, location and other factors that cannot be anticipated I feel this will likely be a staged procedure. This fact supersedes any indication in the scanned [facility] operative note to the contrary.  

## 2020-06-04 ENCOUNTER — Encounter (INDEPENDENT_AMBULATORY_CARE_PROVIDER_SITE_OTHER): Payer: 59 | Admitting: Ophthalmology

## 2020-06-04 ENCOUNTER — Other Ambulatory Visit: Payer: Self-pay

## 2020-06-04 DIAGNOSIS — I10 Essential (primary) hypertension: Secondary | ICD-10-CM | POA: Diagnosis not present

## 2020-06-04 DIAGNOSIS — H35033 Hypertensive retinopathy, bilateral: Secondary | ICD-10-CM | POA: Diagnosis not present

## 2020-06-04 DIAGNOSIS — H4423 Degenerative myopia, bilateral: Secondary | ICD-10-CM

## 2020-06-04 DIAGNOSIS — H43813 Vitreous degeneration, bilateral: Secondary | ICD-10-CM

## 2020-06-04 DIAGNOSIS — H338 Other retinal detachments: Secondary | ICD-10-CM

## 2021-01-10 HISTORY — PX: CHOLECYSTECTOMY: SHX55

## 2021-03-06 ENCOUNTER — Other Ambulatory Visit: Payer: Self-pay | Admitting: Urology

## 2021-03-06 DIAGNOSIS — N201 Calculus of ureter: Secondary | ICD-10-CM

## 2021-03-12 ENCOUNTER — Encounter (HOSPITAL_BASED_OUTPATIENT_CLINIC_OR_DEPARTMENT_OTHER): Payer: Self-pay | Admitting: Urology

## 2021-03-12 NOTE — Progress Notes (Signed)
Patient given instructions.  Driver will be his wife.  Arrival time 75.

## 2021-03-14 ENCOUNTER — Encounter (HOSPITAL_BASED_OUTPATIENT_CLINIC_OR_DEPARTMENT_OTHER)
Admission: RE | Disposition: A | Discharge status: Home / Self Care | Payer: Self-pay | Source: Home / Self Care | Attending: Urology

## 2021-03-14 ENCOUNTER — Other Ambulatory Visit: Payer: Self-pay

## 2021-03-14 ENCOUNTER — Ambulatory Visit (HOSPITAL_COMMUNITY): Payer: 59

## 2021-03-14 ENCOUNTER — Encounter (HOSPITAL_BASED_OUTPATIENT_CLINIC_OR_DEPARTMENT_OTHER): Payer: Self-pay | Admitting: Urology

## 2021-03-14 ENCOUNTER — Ambulatory Visit (HOSPITAL_BASED_OUTPATIENT_CLINIC_OR_DEPARTMENT_OTHER)
Admission: RE | Admit: 2021-03-14 | Discharge: 2021-03-14 | Disposition: A | Discharge status: Home / Self Care | Payer: 59 | Attending: Urology | Admitting: Urology

## 2021-03-14 DIAGNOSIS — N201 Calculus of ureter: Secondary | ICD-10-CM | POA: Diagnosis present

## 2021-03-14 HISTORY — PX: EXTRACORPOREAL SHOCK WAVE LITHOTRIPSY: SHX1557

## 2021-03-14 HISTORY — DX: Atrial septal defect: Q21.1

## 2021-03-14 HISTORY — DX: Atrial septal defect, unspecified: Q21.10

## 2021-03-14 SURGERY — LITHOTRIPSY, ESWL
Anesthesia: LOCAL | Laterality: Left

## 2021-03-14 MED ORDER — DIAZEPAM 5 MG PO TABS
ORAL_TABLET | ORAL | Status: AC
  Filled 2021-03-14: qty 2

## 2021-03-14 MED ORDER — CIPROFLOXACIN HCL 500 MG PO TABS
500.0000 mg | ORAL_TABLET | ORAL | Status: AC
  Administered 2021-03-14: 500 mg via ORAL

## 2021-03-14 MED ORDER — CIPROFLOXACIN HCL 500 MG PO TABS
ORAL_TABLET | ORAL | Status: AC
  Filled 2021-03-14: qty 1

## 2021-03-14 MED ORDER — SODIUM CHLORIDE 0.9 % IV SOLN: INTRAVENOUS | Status: DC

## 2021-03-14 MED ORDER — DIAZEPAM 5 MG PO TABS
10.0000 mg | ORAL_TABLET | ORAL | Status: AC
  Administered 2021-03-14: 10 mg via ORAL

## 2021-03-14 MED ORDER — DIPHENHYDRAMINE HCL 25 MG PO CAPS
25.0000 mg | ORAL_CAPSULE | ORAL | Status: AC
  Administered 2021-03-14: 25 mg via ORAL

## 2021-03-14 MED ORDER — DIPHENHYDRAMINE HCL 25 MG PO CAPS
ORAL_CAPSULE | ORAL | Status: AC
  Filled 2021-03-14: qty 1

## 2021-03-14 NOTE — Discharge Instructions (Signed)
See [facility] discharge instructions in chart.  

## 2021-03-14 NOTE — Op Note (Signed)
See [facility] OP note scanned into chart. Also because of the size, density, location and other factors that cannot be anticipated I feel this will likely be a staged procedure. This fact supersedes any indication in the scanned [facility] operative note to the contrary.  

## 2021-03-14 NOTE — H&P (Signed)
Please see HP scanned into Epic

## 2021-03-15 ENCOUNTER — Encounter (HOSPITAL_BASED_OUTPATIENT_CLINIC_OR_DEPARTMENT_OTHER): Payer: Self-pay | Admitting: Urology

## 2021-04-15 ENCOUNTER — Encounter (INDEPENDENT_AMBULATORY_CARE_PROVIDER_SITE_OTHER): Payer: 59 | Admitting: Ophthalmology

## 2021-04-15 ENCOUNTER — Other Ambulatory Visit: Payer: Self-pay

## 2021-04-15 DIAGNOSIS — H35033 Hypertensive retinopathy, bilateral: Secondary | ICD-10-CM | POA: Diagnosis not present

## 2021-04-15 DIAGNOSIS — H4422 Degenerative myopia, left eye: Secondary | ICD-10-CM

## 2021-04-15 DIAGNOSIS — H43813 Vitreous degeneration, bilateral: Secondary | ICD-10-CM

## 2021-04-15 DIAGNOSIS — H442A1 Degenerative myopia with choroidal neovascularization, right eye: Secondary | ICD-10-CM | POA: Diagnosis not present

## 2021-04-15 DIAGNOSIS — H338 Other retinal detachments: Secondary | ICD-10-CM

## 2021-04-15 DIAGNOSIS — I10 Essential (primary) hypertension: Secondary | ICD-10-CM | POA: Diagnosis not present

## 2021-05-13 ENCOUNTER — Other Ambulatory Visit: Payer: Self-pay

## 2021-05-13 ENCOUNTER — Encounter (INDEPENDENT_AMBULATORY_CARE_PROVIDER_SITE_OTHER): Payer: 59 | Admitting: Ophthalmology

## 2021-05-13 DIAGNOSIS — H43813 Vitreous degeneration, bilateral: Secondary | ICD-10-CM

## 2021-05-13 DIAGNOSIS — H35033 Hypertensive retinopathy, bilateral: Secondary | ICD-10-CM

## 2021-05-13 DIAGNOSIS — I10 Essential (primary) hypertension: Secondary | ICD-10-CM | POA: Diagnosis not present

## 2021-05-13 DIAGNOSIS — H338 Other retinal detachments: Secondary | ICD-10-CM

## 2021-05-13 DIAGNOSIS — H442A1 Degenerative myopia with choroidal neovascularization, right eye: Secondary | ICD-10-CM | POA: Diagnosis not present

## 2021-05-13 DIAGNOSIS — H4422 Degenerative myopia, left eye: Secondary | ICD-10-CM | POA: Diagnosis not present

## 2021-06-04 ENCOUNTER — Encounter (INDEPENDENT_AMBULATORY_CARE_PROVIDER_SITE_OTHER): Payer: 59 | Admitting: Ophthalmology

## 2021-06-10 ENCOUNTER — Other Ambulatory Visit: Payer: Self-pay

## 2021-06-10 ENCOUNTER — Encounter (INDEPENDENT_AMBULATORY_CARE_PROVIDER_SITE_OTHER): Payer: 59 | Admitting: Ophthalmology

## 2021-06-10 DIAGNOSIS — H4422 Degenerative myopia, left eye: Secondary | ICD-10-CM

## 2021-06-10 DIAGNOSIS — H442A1 Degenerative myopia with choroidal neovascularization, right eye: Secondary | ICD-10-CM

## 2021-06-10 DIAGNOSIS — I10 Essential (primary) hypertension: Secondary | ICD-10-CM

## 2021-06-10 DIAGNOSIS — H35033 Hypertensive retinopathy, bilateral: Secondary | ICD-10-CM | POA: Diagnosis not present

## 2021-06-10 DIAGNOSIS — H338 Other retinal detachments: Secondary | ICD-10-CM

## 2021-06-10 DIAGNOSIS — H43813 Vitreous degeneration, bilateral: Secondary | ICD-10-CM

## 2021-07-08 ENCOUNTER — Encounter (INDEPENDENT_AMBULATORY_CARE_PROVIDER_SITE_OTHER): Payer: 59 | Admitting: Ophthalmology

## 2021-07-08 ENCOUNTER — Other Ambulatory Visit: Payer: Self-pay

## 2021-07-08 DIAGNOSIS — H4422 Degenerative myopia, left eye: Secondary | ICD-10-CM | POA: Diagnosis not present

## 2021-07-08 DIAGNOSIS — H43813 Vitreous degeneration, bilateral: Secondary | ICD-10-CM

## 2021-07-08 DIAGNOSIS — H442A1 Degenerative myopia with choroidal neovascularization, right eye: Secondary | ICD-10-CM | POA: Diagnosis not present

## 2021-07-08 DIAGNOSIS — H35033 Hypertensive retinopathy, bilateral: Secondary | ICD-10-CM

## 2021-07-08 DIAGNOSIS — H338 Other retinal detachments: Secondary | ICD-10-CM

## 2021-07-08 DIAGNOSIS — I10 Essential (primary) hypertension: Secondary | ICD-10-CM

## 2021-08-12 ENCOUNTER — Other Ambulatory Visit: Payer: Self-pay

## 2021-08-12 ENCOUNTER — Encounter (INDEPENDENT_AMBULATORY_CARE_PROVIDER_SITE_OTHER): Payer: 59 | Admitting: Ophthalmology

## 2021-08-12 DIAGNOSIS — H4422 Degenerative myopia, left eye: Secondary | ICD-10-CM | POA: Diagnosis not present

## 2021-08-12 DIAGNOSIS — H43813 Vitreous degeneration, bilateral: Secondary | ICD-10-CM

## 2021-08-12 DIAGNOSIS — H35033 Hypertensive retinopathy, bilateral: Secondary | ICD-10-CM

## 2021-08-12 DIAGNOSIS — H442A1 Degenerative myopia with choroidal neovascularization, right eye: Secondary | ICD-10-CM

## 2021-08-12 DIAGNOSIS — I10 Essential (primary) hypertension: Secondary | ICD-10-CM | POA: Diagnosis not present

## 2021-08-12 DIAGNOSIS — H338 Other retinal detachments: Secondary | ICD-10-CM

## 2021-09-16 ENCOUNTER — Other Ambulatory Visit: Payer: Self-pay

## 2021-09-16 ENCOUNTER — Encounter (INDEPENDENT_AMBULATORY_CARE_PROVIDER_SITE_OTHER): Payer: 59 | Admitting: Ophthalmology

## 2021-09-16 DIAGNOSIS — I10 Essential (primary) hypertension: Secondary | ICD-10-CM

## 2021-09-16 DIAGNOSIS — H4422 Degenerative myopia, left eye: Secondary | ICD-10-CM

## 2021-09-16 DIAGNOSIS — H442A1 Degenerative myopia with choroidal neovascularization, right eye: Secondary | ICD-10-CM | POA: Diagnosis not present

## 2021-09-16 DIAGNOSIS — H35033 Hypertensive retinopathy, bilateral: Secondary | ICD-10-CM | POA: Diagnosis not present

## 2021-09-16 DIAGNOSIS — H43813 Vitreous degeneration, bilateral: Secondary | ICD-10-CM

## 2021-10-21 ENCOUNTER — Encounter (INDEPENDENT_AMBULATORY_CARE_PROVIDER_SITE_OTHER): Payer: 59 | Admitting: Ophthalmology

## 2021-10-21 ENCOUNTER — Other Ambulatory Visit: Payer: Self-pay

## 2021-10-21 DIAGNOSIS — H442A1 Degenerative myopia with choroidal neovascularization, right eye: Secondary | ICD-10-CM

## 2021-10-21 DIAGNOSIS — I10 Essential (primary) hypertension: Secondary | ICD-10-CM

## 2021-10-21 DIAGNOSIS — H338 Other retinal detachments: Secondary | ICD-10-CM

## 2021-10-21 DIAGNOSIS — H35033 Hypertensive retinopathy, bilateral: Secondary | ICD-10-CM | POA: Diagnosis not present

## 2021-10-21 DIAGNOSIS — H4422 Degenerative myopia, left eye: Secondary | ICD-10-CM | POA: Diagnosis not present

## 2021-10-21 DIAGNOSIS — H43813 Vitreous degeneration, bilateral: Secondary | ICD-10-CM

## 2021-11-25 ENCOUNTER — Encounter (INDEPENDENT_AMBULATORY_CARE_PROVIDER_SITE_OTHER): Payer: 59 | Admitting: Ophthalmology

## 2022-01-14 ENCOUNTER — Encounter (INDEPENDENT_AMBULATORY_CARE_PROVIDER_SITE_OTHER): Payer: 59 | Admitting: Ophthalmology

## 2022-01-14 DIAGNOSIS — I10 Essential (primary) hypertension: Secondary | ICD-10-CM | POA: Diagnosis not present

## 2022-01-14 DIAGNOSIS — H442A1 Degenerative myopia with choroidal neovascularization, right eye: Secondary | ICD-10-CM | POA: Diagnosis not present

## 2022-01-14 DIAGNOSIS — H35033 Hypertensive retinopathy, bilateral: Secondary | ICD-10-CM

## 2022-01-14 DIAGNOSIS — H43813 Vitreous degeneration, bilateral: Secondary | ICD-10-CM

## 2022-01-14 DIAGNOSIS — H4422 Degenerative myopia, left eye: Secondary | ICD-10-CM | POA: Diagnosis not present

## 2022-01-14 DIAGNOSIS — H338 Other retinal detachments: Secondary | ICD-10-CM | POA: Diagnosis not present

## 2022-03-13 ENCOUNTER — Encounter (INDEPENDENT_AMBULATORY_CARE_PROVIDER_SITE_OTHER): Payer: 59 | Admitting: Ophthalmology

## 2022-03-13 DIAGNOSIS — I10 Essential (primary) hypertension: Secondary | ICD-10-CM | POA: Diagnosis not present

## 2022-03-13 DIAGNOSIS — H35033 Hypertensive retinopathy, bilateral: Secondary | ICD-10-CM

## 2022-03-13 DIAGNOSIS — H442A1 Degenerative myopia with choroidal neovascularization, right eye: Secondary | ICD-10-CM | POA: Diagnosis not present

## 2022-03-13 DIAGNOSIS — H43813 Vitreous degeneration, bilateral: Secondary | ICD-10-CM

## 2022-03-13 DIAGNOSIS — H4422 Degenerative myopia, left eye: Secondary | ICD-10-CM

## 2022-03-13 DIAGNOSIS — H338 Other retinal detachments: Secondary | ICD-10-CM | POA: Diagnosis not present

## 2022-03-14 ENCOUNTER — Encounter (INDEPENDENT_AMBULATORY_CARE_PROVIDER_SITE_OTHER): Payer: 59 | Admitting: Ophthalmology

## 2022-05-15 ENCOUNTER — Encounter (INDEPENDENT_AMBULATORY_CARE_PROVIDER_SITE_OTHER): Payer: 59 | Admitting: Ophthalmology

## 2022-05-22 ENCOUNTER — Encounter (INDEPENDENT_AMBULATORY_CARE_PROVIDER_SITE_OTHER): Payer: 59 | Admitting: Ophthalmology

## 2022-05-22 DIAGNOSIS — H4422 Degenerative myopia, left eye: Secondary | ICD-10-CM

## 2022-05-22 DIAGNOSIS — H442A1 Degenerative myopia with choroidal neovascularization, right eye: Secondary | ICD-10-CM | POA: Diagnosis not present

## 2022-05-22 DIAGNOSIS — H338 Other retinal detachments: Secondary | ICD-10-CM | POA: Diagnosis not present

## 2022-05-22 DIAGNOSIS — I10 Essential (primary) hypertension: Secondary | ICD-10-CM

## 2022-05-22 DIAGNOSIS — H35033 Hypertensive retinopathy, bilateral: Secondary | ICD-10-CM

## 2022-05-22 DIAGNOSIS — H43813 Vitreous degeneration, bilateral: Secondary | ICD-10-CM

## 2022-05-22 DIAGNOSIS — H33301 Unspecified retinal break, right eye: Secondary | ICD-10-CM

## 2022-07-24 ENCOUNTER — Encounter (INDEPENDENT_AMBULATORY_CARE_PROVIDER_SITE_OTHER): Payer: 59 | Admitting: Ophthalmology

## 2022-07-30 ENCOUNTER — Encounter (INDEPENDENT_AMBULATORY_CARE_PROVIDER_SITE_OTHER): Payer: 59 | Admitting: Ophthalmology

## 2022-07-30 DIAGNOSIS — H442A1 Degenerative myopia with choroidal neovascularization, right eye: Secondary | ICD-10-CM

## 2022-07-30 DIAGNOSIS — I10 Essential (primary) hypertension: Secondary | ICD-10-CM

## 2022-07-30 DIAGNOSIS — H35033 Hypertensive retinopathy, bilateral: Secondary | ICD-10-CM

## 2022-07-30 DIAGNOSIS — H4422 Degenerative myopia, left eye: Secondary | ICD-10-CM | POA: Diagnosis not present

## 2022-07-30 DIAGNOSIS — H338 Other retinal detachments: Secondary | ICD-10-CM | POA: Diagnosis not present

## 2022-10-02 ENCOUNTER — Encounter (INDEPENDENT_AMBULATORY_CARE_PROVIDER_SITE_OTHER): Payer: 59 | Admitting: Ophthalmology

## 2022-10-02 DIAGNOSIS — I10 Essential (primary) hypertension: Secondary | ICD-10-CM | POA: Diagnosis not present

## 2022-10-02 DIAGNOSIS — H4422 Degenerative myopia, left eye: Secondary | ICD-10-CM

## 2022-10-02 DIAGNOSIS — H35033 Hypertensive retinopathy, bilateral: Secondary | ICD-10-CM | POA: Diagnosis not present

## 2022-10-02 DIAGNOSIS — H43813 Vitreous degeneration, bilateral: Secondary | ICD-10-CM

## 2022-10-02 DIAGNOSIS — H338 Other retinal detachments: Secondary | ICD-10-CM

## 2022-10-02 DIAGNOSIS — H442A1 Degenerative myopia with choroidal neovascularization, right eye: Secondary | ICD-10-CM | POA: Diagnosis not present

## 2022-12-11 ENCOUNTER — Encounter (INDEPENDENT_AMBULATORY_CARE_PROVIDER_SITE_OTHER): Payer: 59 | Admitting: Ophthalmology

## 2022-12-18 ENCOUNTER — Encounter (INDEPENDENT_AMBULATORY_CARE_PROVIDER_SITE_OTHER): Payer: 59 | Admitting: Ophthalmology

## 2022-12-18 DIAGNOSIS — H4422 Degenerative myopia, left eye: Secondary | ICD-10-CM

## 2022-12-18 DIAGNOSIS — H35033 Hypertensive retinopathy, bilateral: Secondary | ICD-10-CM

## 2022-12-18 DIAGNOSIS — I10 Essential (primary) hypertension: Secondary | ICD-10-CM | POA: Diagnosis not present

## 2022-12-18 DIAGNOSIS — H442A1 Degenerative myopia with choroidal neovascularization, right eye: Secondary | ICD-10-CM

## 2022-12-18 DIAGNOSIS — H43813 Vitreous degeneration, bilateral: Secondary | ICD-10-CM

## 2023-02-09 IMAGING — DX DG ABDOMEN 1V
2 series · 2 of 2 positions shown · non-contrast
Comparison: Abdominal x-ray dated March 05, 2021.

CLINICAL DATA: Preop lithotripsy.

EXAM:
ABDOMEN - 1 VIEW

[abdomen kub (1 of 2)]
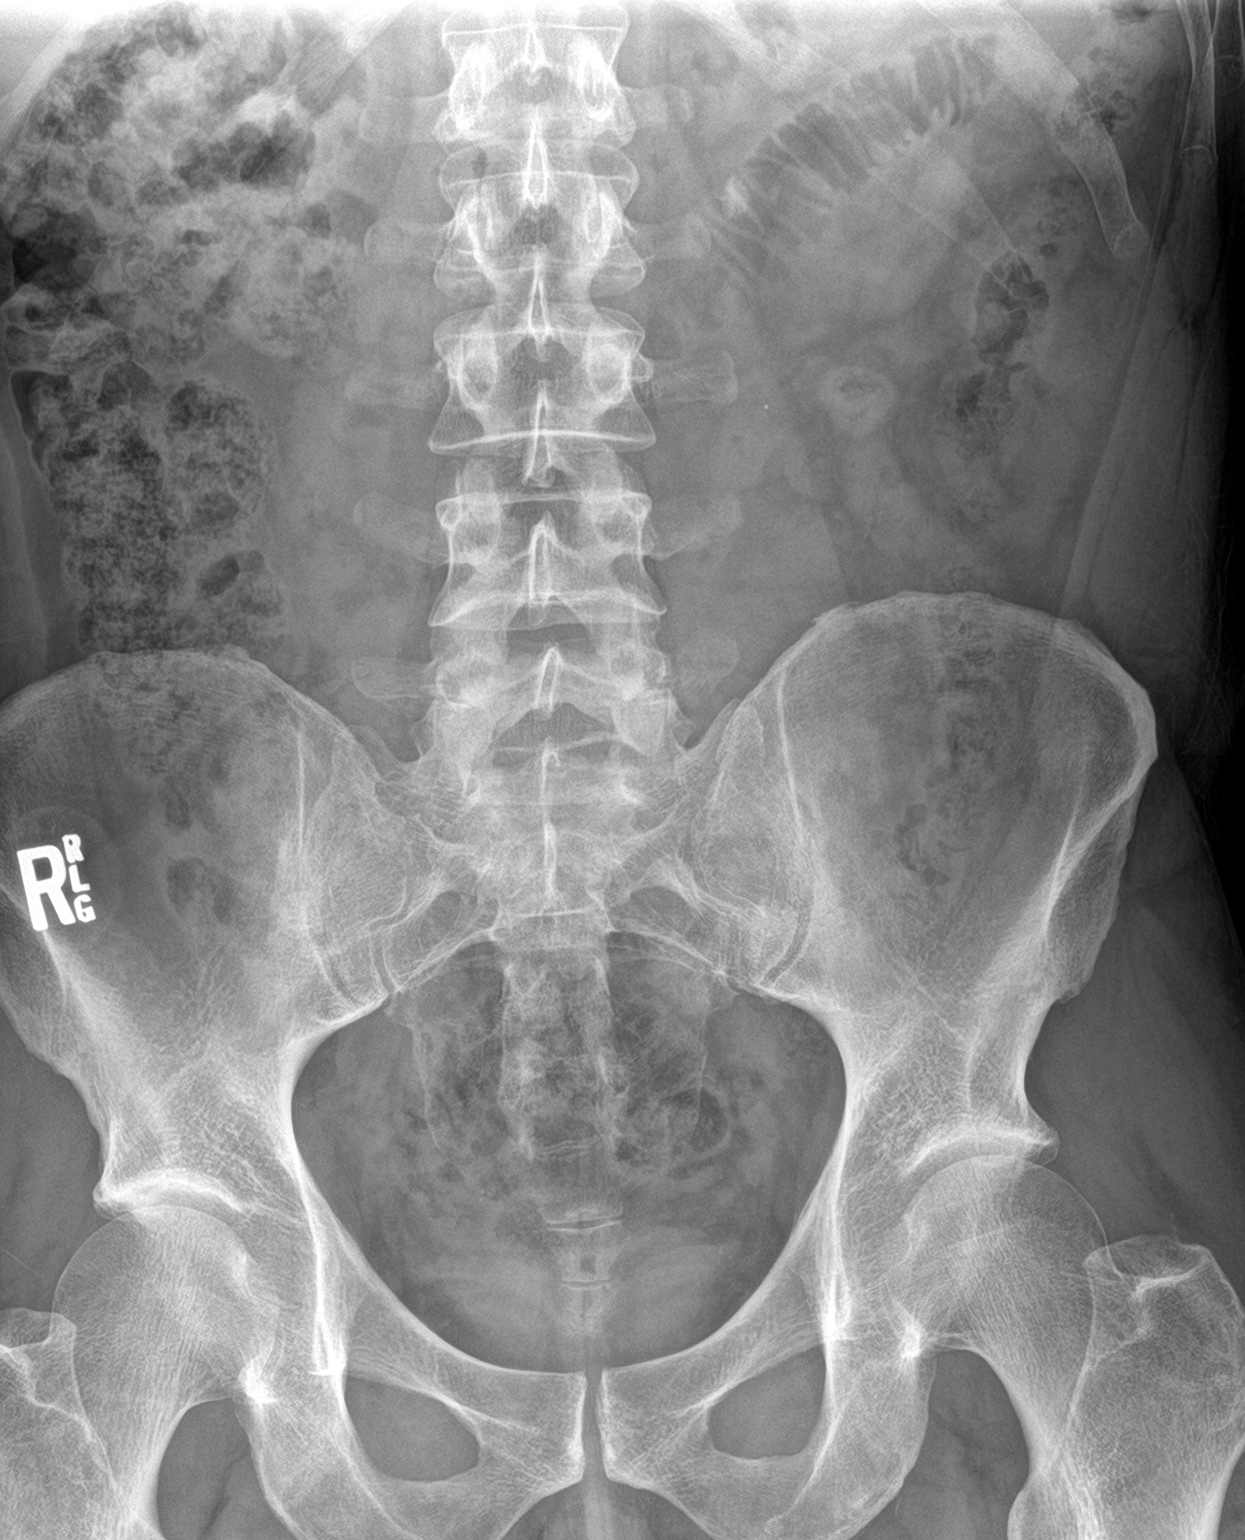

[abdomen kub (2 of 2)]
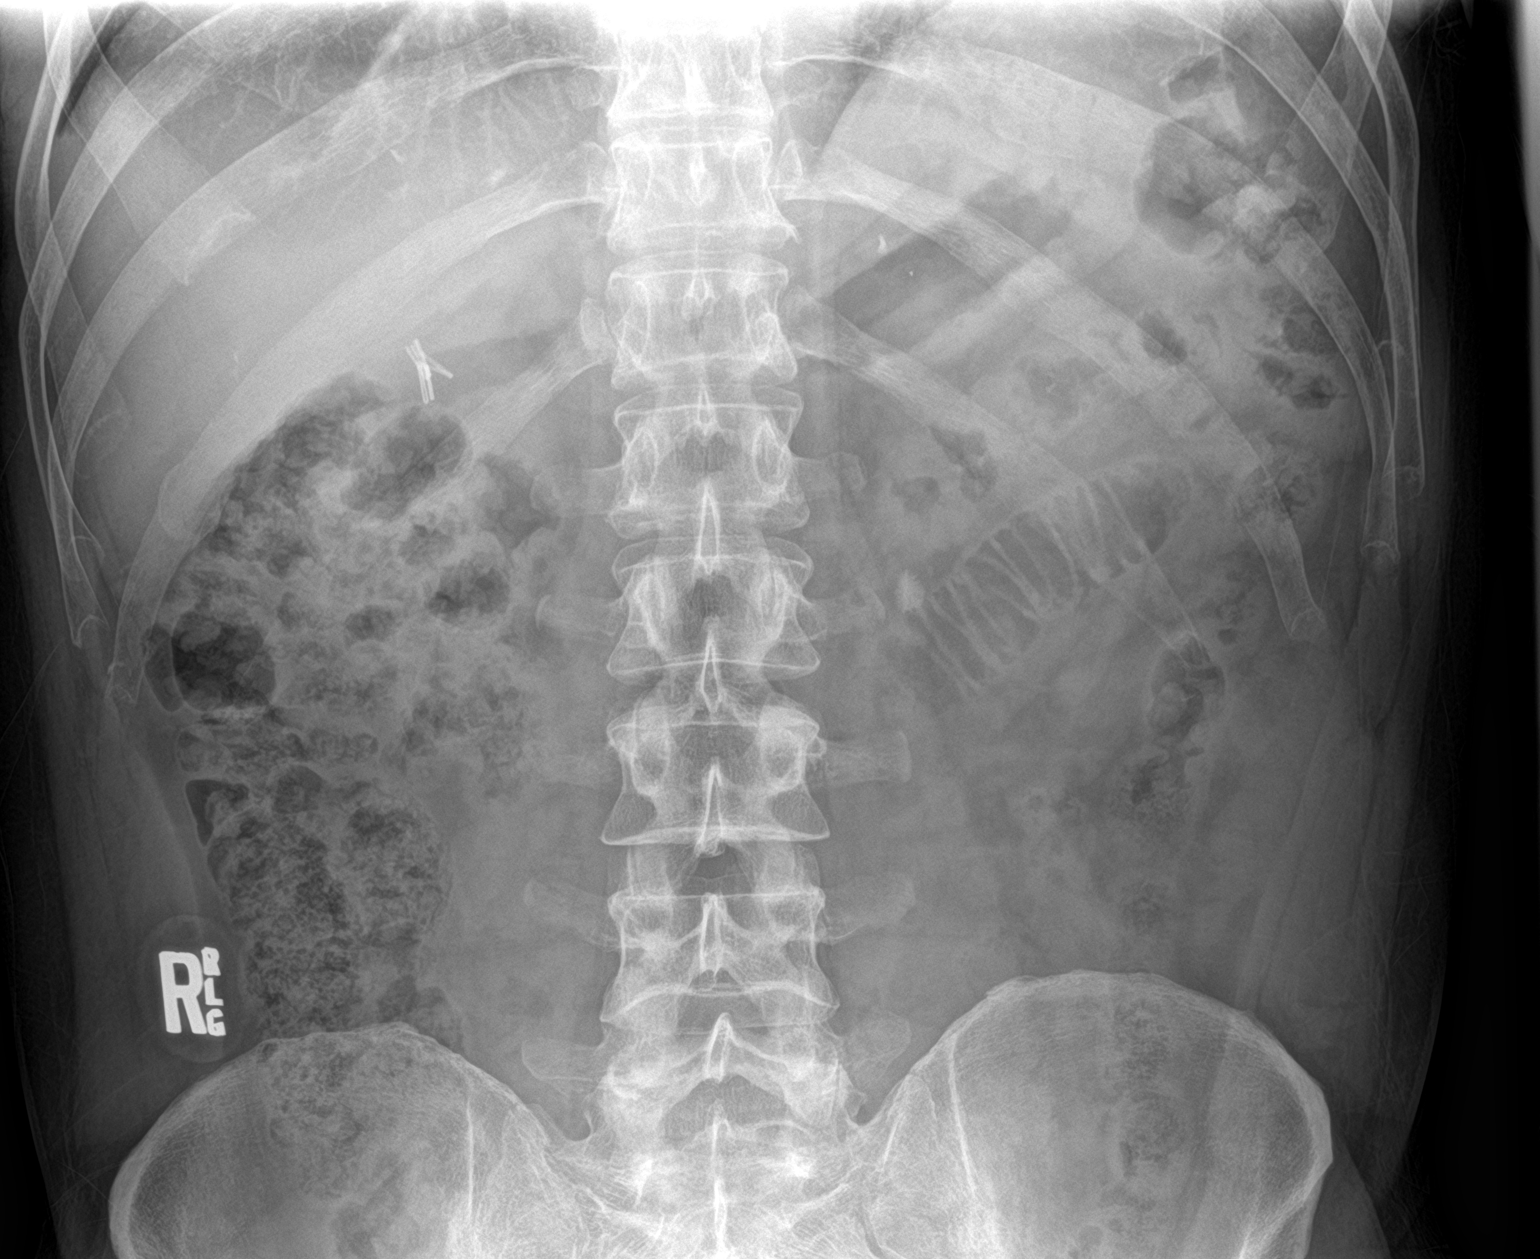

[2 of 2 positions shown; findings below may reference images not displayed]

FINDINGS: Unchanged 10 mm calculus in the expected region of the left UPJ.
Normal bowel gas pattern. No acute osseous abnormality. Prior
cholecystectomy.
IMPRESSION: 1. Unchanged 10 mm left UPJ calculus.

## 2023-02-19 ENCOUNTER — Encounter (INDEPENDENT_AMBULATORY_CARE_PROVIDER_SITE_OTHER): Payer: 59 | Admitting: Ophthalmology

## 2023-02-19 DIAGNOSIS — H442A1 Degenerative myopia with choroidal neovascularization, right eye: Secondary | ICD-10-CM | POA: Diagnosis not present

## 2023-02-19 DIAGNOSIS — H35033 Hypertensive retinopathy, bilateral: Secondary | ICD-10-CM | POA: Diagnosis not present

## 2023-02-19 DIAGNOSIS — H43813 Vitreous degeneration, bilateral: Secondary | ICD-10-CM

## 2023-02-19 DIAGNOSIS — H4422 Degenerative myopia, left eye: Secondary | ICD-10-CM | POA: Diagnosis not present

## 2023-02-19 DIAGNOSIS — I10 Essential (primary) hypertension: Secondary | ICD-10-CM | POA: Diagnosis not present

## 2023-04-28 ENCOUNTER — Encounter (INDEPENDENT_AMBULATORY_CARE_PROVIDER_SITE_OTHER): Payer: 59 | Admitting: Ophthalmology

## 2023-05-14 ENCOUNTER — Encounter (INDEPENDENT_AMBULATORY_CARE_PROVIDER_SITE_OTHER): Payer: 59 | Admitting: Ophthalmology

## 2023-05-14 DIAGNOSIS — H338 Other retinal detachments: Secondary | ICD-10-CM | POA: Diagnosis not present

## 2023-05-14 DIAGNOSIS — H442A1 Degenerative myopia with choroidal neovascularization, right eye: Secondary | ICD-10-CM | POA: Diagnosis not present

## 2023-05-14 DIAGNOSIS — H43813 Vitreous degeneration, bilateral: Secondary | ICD-10-CM | POA: Diagnosis not present

## 2023-05-14 DIAGNOSIS — H4422 Degenerative myopia, left eye: Secondary | ICD-10-CM | POA: Diagnosis not present

## 2023-05-15 ENCOUNTER — Encounter (INDEPENDENT_AMBULATORY_CARE_PROVIDER_SITE_OTHER): Payer: 59 | Admitting: Ophthalmology

## 2023-07-22 ENCOUNTER — Encounter (INDEPENDENT_AMBULATORY_CARE_PROVIDER_SITE_OTHER): Payer: 59 | Admitting: Ophthalmology

## 2023-08-06 ENCOUNTER — Encounter (INDEPENDENT_AMBULATORY_CARE_PROVIDER_SITE_OTHER): Payer: 59 | Admitting: Ophthalmology

## 2023-08-06 DIAGNOSIS — H43813 Vitreous degeneration, bilateral: Secondary | ICD-10-CM

## 2023-08-06 DIAGNOSIS — H338 Other retinal detachments: Secondary | ICD-10-CM | POA: Diagnosis not present

## 2023-08-06 DIAGNOSIS — H33301 Unspecified retinal break, right eye: Secondary | ICD-10-CM | POA: Diagnosis not present

## 2023-08-06 DIAGNOSIS — H442A1 Degenerative myopia with choroidal neovascularization, right eye: Secondary | ICD-10-CM | POA: Diagnosis not present

## 2023-08-06 DIAGNOSIS — H4422 Degenerative myopia, left eye: Secondary | ICD-10-CM

## 2023-10-01 ENCOUNTER — Encounter (INDEPENDENT_AMBULATORY_CARE_PROVIDER_SITE_OTHER): Payer: 59 | Admitting: Ophthalmology

## 2023-10-01 DIAGNOSIS — H4422 Degenerative myopia, left eye: Secondary | ICD-10-CM | POA: Diagnosis not present

## 2023-10-01 DIAGNOSIS — H338 Other retinal detachments: Secondary | ICD-10-CM

## 2023-10-01 DIAGNOSIS — H43813 Vitreous degeneration, bilateral: Secondary | ICD-10-CM

## 2023-10-01 DIAGNOSIS — H442A1 Degenerative myopia with choroidal neovascularization, right eye: Secondary | ICD-10-CM | POA: Diagnosis not present

## 2023-10-08 ENCOUNTER — Encounter (INDEPENDENT_AMBULATORY_CARE_PROVIDER_SITE_OTHER): Payer: 59 | Admitting: Ophthalmology

## 2023-12-01 ENCOUNTER — Encounter (INDEPENDENT_AMBULATORY_CARE_PROVIDER_SITE_OTHER): Payer: 59 | Admitting: Ophthalmology

## 2023-12-24 ENCOUNTER — Encounter (INDEPENDENT_AMBULATORY_CARE_PROVIDER_SITE_OTHER): Admitting: Ophthalmology

## 2023-12-24 DIAGNOSIS — H4422 Degenerative myopia, left eye: Secondary | ICD-10-CM

## 2023-12-24 DIAGNOSIS — H338 Other retinal detachments: Secondary | ICD-10-CM | POA: Diagnosis not present

## 2023-12-24 DIAGNOSIS — H442A1 Degenerative myopia with choroidal neovascularization, right eye: Secondary | ICD-10-CM | POA: Diagnosis not present

## 2023-12-24 DIAGNOSIS — H43813 Vitreous degeneration, bilateral: Secondary | ICD-10-CM

## 2024-03-10 ENCOUNTER — Encounter (INDEPENDENT_AMBULATORY_CARE_PROVIDER_SITE_OTHER): Admitting: Ophthalmology

## 2024-03-10 DIAGNOSIS — H4422 Degenerative myopia, left eye: Secondary | ICD-10-CM | POA: Diagnosis not present

## 2024-03-10 DIAGNOSIS — H442A1 Degenerative myopia with choroidal neovascularization, right eye: Secondary | ICD-10-CM | POA: Diagnosis not present

## 2024-06-02 ENCOUNTER — Encounter (INDEPENDENT_AMBULATORY_CARE_PROVIDER_SITE_OTHER): Admitting: Ophthalmology

## 2024-06-02 DIAGNOSIS — H442A1 Degenerative myopia with choroidal neovascularization, right eye: Secondary | ICD-10-CM

## 2024-06-02 DIAGNOSIS — I10 Essential (primary) hypertension: Secondary | ICD-10-CM

## 2024-06-02 DIAGNOSIS — H43813 Vitreous degeneration, bilateral: Secondary | ICD-10-CM

## 2024-06-02 DIAGNOSIS — H338 Other retinal detachments: Secondary | ICD-10-CM

## 2024-06-02 DIAGNOSIS — H4422 Degenerative myopia, left eye: Secondary | ICD-10-CM | POA: Diagnosis not present

## 2024-06-02 DIAGNOSIS — H35033 Hypertensive retinopathy, bilateral: Secondary | ICD-10-CM

## 2024-09-01 ENCOUNTER — Encounter (INDEPENDENT_AMBULATORY_CARE_PROVIDER_SITE_OTHER): Admitting: Ophthalmology

## 2024-09-01 DIAGNOSIS — H43813 Vitreous degeneration, bilateral: Secondary | ICD-10-CM

## 2024-09-01 DIAGNOSIS — H4422 Degenerative myopia, left eye: Secondary | ICD-10-CM

## 2024-09-01 DIAGNOSIS — H442A1 Degenerative myopia with choroidal neovascularization, right eye: Secondary | ICD-10-CM | POA: Diagnosis not present

## 2024-09-29 ENCOUNTER — Encounter (INDEPENDENT_AMBULATORY_CARE_PROVIDER_SITE_OTHER): Admitting: Ophthalmology

## 2024-11-24 ENCOUNTER — Encounter (INDEPENDENT_AMBULATORY_CARE_PROVIDER_SITE_OTHER): Admitting: Ophthalmology
